# Patient Record
Sex: Male | Born: 1985 | Race: White | Hispanic: No | Marital: Married | Smoking: Former smoker
Health system: Southern US, Community
[De-identification: ages and names within clinical notes are randomized; demographics above are authoritative.]

---
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
## 2019-10-03 ENCOUNTER — Encounter (HOSPITAL_COMMUNITY): Payer: Self-pay | Admitting: *Deleted

## 2019-10-03 ENCOUNTER — Emergency Department (HOSPITAL_COMMUNITY)
Admission: EM | Admit: 2019-10-03 | Discharge: 2019-10-04 | Disposition: A | Discharge status: Home / Self Care | Payer: Self-pay | Attending: Emergency Medicine | Admitting: Emergency Medicine

## 2019-10-03 ENCOUNTER — Other Ambulatory Visit: Payer: Self-pay

## 2019-10-03 DIAGNOSIS — M79602 Pain in left arm: Secondary | ICD-10-CM | POA: Insufficient documentation

## 2019-10-03 DIAGNOSIS — M7989 Other specified soft tissue disorders: Secondary | ICD-10-CM | POA: Insufficient documentation

## 2019-10-03 DIAGNOSIS — F17210 Nicotine dependence, cigarettes, uncomplicated: Secondary | ICD-10-CM | POA: Insufficient documentation

## 2019-10-03 NOTE — ED Triage Notes (Signed)
Pt c/o swelling to left forearm area, thinks that he may have gotten bitten by an insect.

## 2019-10-03 NOTE — ED Notes (Signed)
Pt reports he was riding his bikeearlier today and does not recall being bit or stung by any kind of bug. Pts wife suggested evaluation for possible cellulitis in arm. Warmth and redness noted on left forearm over the right eye of a skull tattoo.

## 2019-10-04 MED ORDER — CEPHALEXIN 500 MG PO CAPS
500.0000 mg | ORAL_CAPSULE | Freq: Once | ORAL | Status: AC
  Administered 2019-10-04: 500 mg via ORAL
  Filled 2019-10-04: qty 1

## 2019-10-04 MED ORDER — CEPHALEXIN 500 MG PO CAPS: 500.0000 mg | ORAL_CAPSULE | Freq: Three times a day (TID) | ORAL | 0 refills | Status: AC

## 2019-10-04 NOTE — ED Notes (Signed)
ED Provider at bedside. 

## 2019-10-04 NOTE — ED Provider Notes (Signed)
[facility] EMERGENCY DEPARTMENT Provider Note   CSN: [id number] Arrival date & time: 10/03/19  2116    History Chief Complaint  Patient presents with  . Arm Pain    Curtis Hoffman is a 34 y.o. male.  The history is provided by the patient.  Arm Pain  He has no significant past history and comes in complaining of some pain and swelling of his left forearm.  He denies any trauma.  He notices that it feels a little warm.  His wife is concerned that he might have cellulitis so recommended he come in for further evaluation.  He rates pain at 1/10.  He denies fever, chills, sweats.  History reviewed. No pertinent past medical history.  There are no problems to display for this patient.   History reviewed. No pertinent surgical history.     No family history on file.  Social History   Tobacco Use  . Smoking status: Current Every Day Smoker  Substance Use Topics  . Alcohol use: Not Currently  . Drug use: Not Currently    Home Medications Prior to Admission medications   Not on File    Allergies    Patient has no known allergies.  Review of Systems   Review of Systems  All other systems reviewed and are negative.   Physical Exam Updated Vital Signs BP 140/70 (BP Location: Right Arm)   Pulse 82   Temp 98.6 F (37 C) (Oral)   Resp 16   Ht 5\' 4"  (1.626 m)   Wt (!) 171.9 kg   SpO2 100%   BMI 65.06 kg/m   Physical Exam Vitals and nursing note reviewed.   Morbidly obese 34 year old male, resting comfortably and in no acute distress. Vital signs are normal. Oxygen saturation is 100%, which is normal. Head is normocephalic and atraumatic. PERRLA, EOMI. Oropharynx is clear. Neck is nontender and supple without adenopathy or JVD. Back is nontender and there is no CVA tenderness. Lungs are clear without rales, wheezes, or rhonchi. Chest is nontender. Heart has regular rate and rhythm without murmur. Abdomen is soft, flat, nontender without masses or  hepatosplenomegaly and peristalsis is normoactive. Extremities: Extensive tattoos preclude careful evaluation of skin but no obvious erythema noted on the left forearm.  There is a suggestion of slight subcutaneous swelling without any induration or fluctuance.  There is no warmth to palpation and no tenderness. Skin is warm and dry without rash. Neurologic: Mental status is normal, cranial nerves are intact, there are no motor or sensory deficits.  ED Results / Procedures / Treatments   Labs (all labs ordered are listed, but only abnormal results are displayed) Labs Reviewed - No data to display  EKG None  Radiology No results found.  Procedures Ultrasound ED Soft Tissue  Date/Time: 10/04/2019 12:30 AM Performed by: Delora Fuel, MD Authorized by: Delora Fuel, MD   Procedure details:    Indications: evaluate for cellulitis     Transverse view:  Visualized   Longitudinal view:  Visualized   Images: archived   Location:    Location: upper extremity     Side:  Left Findings:     no abscess present    no cellulitis present   Medications Ordered in ED Medications  cephALEXin (KEFLEX) capsule 500 mg (has no administration in time range)    ED Course  I have reviewed the triage vital signs and the nursing notes.  MDM Rules/Calculators/A&P Mild pain and swelling of the left forearm.  With suggestion of some subcutaneous swelling, I am concerned about early abscess formation.  Limited bedside ultrasound was done showing no clear evidence of either abscess or cellulitis.  Will empirically placed on a short course of cephalexin.  Patient advised to use warm compresses, return if pain and swelling or worsening.  Possibility of early abscess that is not well enough formed to be seen on ultrasound was explained to patient.  Old records were reviewed, and he has no relevant past visits.  Final Clinical Impression(s) / ED Diagnoses Final diagnoses:  Left arm pain    Rx / DC  Orders ED Discharge Orders         Ordered    cephALEXin (KEFLEX) 500 MG capsule  3 times daily     10/04/19 0027           Dione Booze, MD 10/04/19 [phone]

## 2019-10-04 NOTE — Discharge Instructions (Addendum)
Apply warm compresses.  Take acetaminophen and/or ibuprofen as needed for pain.  If pain and swelling seem to be getting worse, return for reevaluation.

## 2022-02-26 ENCOUNTER — Other Ambulatory Visit: Payer: Self-pay

## 2022-02-26 ENCOUNTER — Encounter (HOSPITAL_COMMUNITY): Payer: Self-pay | Admitting: Emergency Medicine

## 2022-02-26 ENCOUNTER — Emergency Department (HOSPITAL_COMMUNITY)
Admission: EM | Admit: 2022-02-26 | Discharge: 2022-02-26 | Disposition: A | Discharge status: Home / Self Care | Payer: PRIVATE HEALTH INSURANCE | Attending: Emergency Medicine | Admitting: Emergency Medicine

## 2022-02-26 DIAGNOSIS — T1501XA Foreign body in cornea, right eye, initial encounter: Secondary | ICD-10-CM | POA: Insufficient documentation

## 2022-02-26 DIAGNOSIS — X58XXXA Exposure to other specified factors, initial encounter: Secondary | ICD-10-CM | POA: Insufficient documentation

## 2022-02-26 DIAGNOSIS — S0501XA Injury of conjunctiva and corneal abrasion without foreign body, right eye, initial encounter: Secondary | ICD-10-CM

## 2022-02-26 MED ORDER — FLUORESCEIN SODIUM 1 MG OP STRP
1.0000 | ORAL_STRIP | Freq: Once | OPHTHALMIC | Status: AC
  Administered 2022-02-26: 1 via OPHTHALMIC
  Filled 2022-02-26: qty 1

## 2022-02-26 MED ORDER — ERYTHROMYCIN 5 MG/GM OP OINT
TOPICAL_OINTMENT | Freq: Once | OPHTHALMIC | Status: AC
  Filled 2022-02-26: qty 3.5

## 2022-02-26 MED ORDER — TETRACAINE HCL 0.5 % OP SOLN
1.0000 [drp] | Freq: Once | OPHTHALMIC | Status: DC
  Filled 2022-02-26: qty 4

## 2022-02-26 MED ORDER — FLUORESCEIN SODIUM 1 MG OP STRP
1.0000 | ORAL_STRIP | Freq: Once | OPHTHALMIC | Status: DC
  Filled 2022-02-26: qty 1

## 2022-02-26 MED ORDER — ERYTHROMYCIN 5 MG/GM OP OINT: TOPICAL_OINTMENT | OPHTHALMIC | 0 refills | Status: AC

## 2022-02-26 MED ORDER — TETRACAINE HCL 0.5 % OP SOLN
2.0000 [drp] | Freq: Once | OPHTHALMIC | Status: AC
  Administered 2022-02-26: 2 [drp] via OPHTHALMIC

## 2022-02-26 NOTE — Discharge Instructions (Signed)
Your exam appears to be consistent with a small scratch across her eye as well as a very tiny foreign body in the lower cornea.  You will need to see an eye doctor.  I have given you the phone number for the eye doctor who is on-call, you should call their office in the morning, they are usually good about seeing you within 1 or 2 days.  The medicine that you will need to be using between now and then is called erythromycin ointment, please put about 1/4 inch strip or ribbon on your eyeball and blanket and 4 times a day.  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

## 2022-02-26 NOTE — ED Triage Notes (Signed)
Pt states he was "grinding something" on his sons truck without wearing eye protection and states he thinks he either has a piece of metal in his R eye or an "ember got in there". States he tried flusing his eye out but that his eye continues to burning and watering.

## 2022-02-26 NOTE — ED Notes (Signed)
Went over dc papers. All questions answered. 

## 2022-02-26 NOTE — ED Provider Notes (Signed)
[facility] EMERGENCY DEPARTMENT Provider Note   CSN: [id number] Arrival date & time: 02/26/22  2231     History  Chief Complaint  Patient presents with   Curtis Hoffman is a 36 y.o. male.   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  Today he was using a metal grinder when he felt something fly into his eye causing acute onset of pain and then a scratchy feeling for the rest of the day.  The patient denies any changes in his vision, states it is 2020, it has not been blurry but he has had some excessive watering on that side with some sensitivity as well.  He has no discomfort on the left side, no nausea or vomiting, no prior injuries to the eyes, he does not wear contact lenses.    Home Medications Prior to Admission medications   Medication Sig Start Date End Date Taking? Authorizing Provider  erythromycin ophthalmic ointment Place a 1/2 inch ribbon of ointment into the lower eyelid. 02/26/22  Yes Noemi Chapel, MD  cephALEXin (KEFLEX) 500 MG capsule Take 1 capsule (500 mg total) by mouth 3 (three) times daily. 2/35/36   Delora Fuel, MD      Allergies    Patient has no known allergies.    Review of Systems   Review of Systems  Eyes:  Positive for pain and redness.    Physical Exam Updated Vital Signs BP (!) 154/93 (BP Location: Left Arm)   Pulse 67   Temp 97.6 F (36.4 C) (Oral) Comment: Simultaneous filing. User may not have seen previous data. Comment (Src): Simultaneous filing. User may not have seen previous data.  Resp 18   Ht 1.626 m (5\' 4" )   Wt 111.6 kg   SpO2 99%   BMI 42.23 kg/m  Physical Exam Vitals and nursing note reviewed.  Constitutional:      Appearance: He is well-developed. He is not diaphoretic.  HENT:     Head: Normocephalic and atraumatic.  Eyes:     General:        Right eye: No discharge.        Left eye: No discharge.     Conjunctiva/sclera: Conjunctivae  normal.     Comments: Left eye examined, no signs of redness trauma or injury, periorbital tissues are clear, conjunctivitis clear, pupils normal and reactive.  Right eye examined, no signs of periorbital redness or swelling, upper and lower lids are normal, on eversion there is no signs of Curtis body, under tetracaine and fluorescein exam it is clear that there is a superficial abrasion across the middle of the cornea in the horizontal position, there is a very tiny Curtis body in the inferior position at approximately 6:00.  This is a pinprick appearance.  There is no hyphema, no hypopyon, normal-appearing pupil, no consensual pain, minimal conjunctival erythema.  Again there is no Curtis body seen that need to be taken out of the eye on eversion of the lids.  This was irrigated at the bedside.  There was fluorescein uptake in the very superficial abrasion.  Negative Seidel test  Pulmonary:     Effort: Pulmonary effort is normal. No respiratory distress.  Skin:    General: Skin is warm and dry.     Findings: No erythema or rash.  Neurological:     Mental Status: He is alert.     Coordination: Coordination  normal.     ED Results / Procedures / Treatments   Labs (all labs ordered are listed, but only abnormal results are displayed) Labs Reviewed - No data to display  EKG None  Radiology No results found.  Procedures Procedures    Medications Ordered in ED Medications  tetracaine (PONTOCAINE) 0.5 % ophthalmic solution 1 drop (1 drop Both Eyes Not Given 02/26/22 2247)  fluorescein ophthalmic strip 1 strip (1 strip Both Eyes Not Given 02/26/22 2247)  erythromycin ophthalmic ointment (has no administration in time range)  tetracaine (PONTOCAINE) 0.5 % ophthalmic solution 2 drop (2 drops Right Eye Given by Other 02/26/22 2246)  fluorescein ophthalmic strip 1 strip (1 strip Right Eye Given by Other 02/26/22 2246)    ED Course/ Medical Decision Making/ A&P                            Medical Decision Making Risk Prescription drug management.   Eye exam consistent with superficial corneal abrasion and a very small Curtis body.  This is so tiny that it does not even appear visualized as far as what is in this tiny area however it does not appear to be a globe rupture, he will need to be seen by ophthalmology, will start on erythromycin ointment.  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  He will be given referral to an ophthalmology office for follow-up, the patient is agreeable, he otherwise appears very stable, I do not think he needs an emergent ophthalmology consultation tonight but can be seen tomorrow.  He agrees to the plan        Final Clinical Impression(s) / ED Diagnoses Final diagnoses:  Abrasion of right cornea, initial encounter  Curtis body of right cornea, initial encounter    Rx / DC Orders ED Discharge Orders          Ordered    erythromycin ophthalmic ointment        02/26/22 2259              Noemi Chapel, MD 02/26/22 2303

## 2022-02-26 NOTE — ED Notes (Signed)
ED Provider at bedside. 

## 2022-02-27 ENCOUNTER — Encounter (HOSPITAL_COMMUNITY): Payer: Self-pay | Admitting: Emergency Medicine

## 2022-02-27 ENCOUNTER — Emergency Department (HOSPITAL_COMMUNITY)
Admission: EM | Admit: 2022-02-27 | Discharge: 2022-02-27 | Disposition: A | Discharge status: Home / Self Care | Payer: PRIVATE HEALTH INSURANCE | Attending: Emergency Medicine | Admitting: Emergency Medicine

## 2022-02-27 DIAGNOSIS — X58XXXA Exposure to other specified factors, initial encounter: Secondary | ICD-10-CM | POA: Diagnosis not present

## 2022-02-27 DIAGNOSIS — T1501XA Foreign body in cornea, right eye, initial encounter: Secondary | ICD-10-CM | POA: Diagnosis present

## 2022-02-27 MED ORDER — OXYCODONE-ACETAMINOPHEN 5-325 MG PO TABS: 2.0000 | ORAL_TABLET | ORAL | 0 refills | Status: AC | PRN

## 2022-02-27 MED ORDER — TETRACAINE HCL 0.5 % OP SOLN
2.0000 [drp] | Freq: Once | OPHTHALMIC | Status: AC
  Administered 2022-02-27: 2 [drp] via OPHTHALMIC
  Filled 2022-02-27: qty 4

## 2022-02-27 NOTE — ED Triage Notes (Signed)
Pt here earlier for possible metal in his eye. Pt states he was told to follow up with eye MD at 8am this morning, but he can't wait til then the pain in his eye is driving him crazy.

## 2022-02-27 NOTE — Discharge Instructions (Signed)
Take Percocet as prescribed as needed for pain.  Follow-up with ophthalmology as planned previously.

## 2022-02-27 NOTE — ED Provider Notes (Signed)
  [facility] EMERGENCY DEPARTMENT Provider Note   CSN: [id number] Arrival date & time: 02/27/22  0457     History  Chief Complaint  Patient presents with   Eye Pain    Curtis Hoffman is a 37 y.o. male.  Patient is a 36 year old male presenting with right thigh pain.  He was here several hours ago with similar complaints.  Patient was using a grinder to grind metal when a shard of metal got stuck in his eye.  He was referred to ophthalmology, but returns stating the pain in his eye is "driving him crazy".  He cannot sleep due to the pain.  He is to call ophthalmology after 8 AM, but cannot wait that long.  The history is provided by the patient.       Home Medications Prior to Admission medications   Medication Sig Start Date End Date Taking? Authorizing Provider  cephALEXin (KEFLEX) 500 MG capsule Take 1 capsule (500 mg total) by mouth 3 (three) times daily. 0/08/67   Delora Fuel, MD  erythromycin ophthalmic ointment Place a 1/2 inch ribbon of ointment into the lower eyelid. 02/26/22   Noemi Chapel, MD      Allergies    Patient has no known allergies.    Review of Systems   Review of Systems  All other systems reviewed and are negative.   Physical Exam Updated Vital Signs BP (!) 147/97   Pulse 88   Temp 98 F (36.7 C) (Oral)   Resp 20   Ht 5\' 4"  (1.626 m)   Wt 111.6 kg   SpO2 99%   BMI 42.23 kg/m  Physical Exam Vitals and nursing note reviewed.  Constitutional:      General: He is not in acute distress.    Appearance: Normal appearance. He is not ill-appearing.  HENT:     Head: Normocephalic and atraumatic.  Eyes:     Comments: The right pupil is round and reactive.  Anterior chamber is clear.  The cornea does have a tiny foreign body noted at approximately 6:00.  Pulmonary:     Effort: Pulmonary effort is normal.     Breath sounds: Normal breath sounds.  Neurological:     Mental Status: He is alert and oriented to person, place, and time.     ED  Results / Procedures / Treatments   Labs (all labs ordered are listed, but only abnormal results are displayed) Labs Reviewed - No data to display  EKG None  Radiology No results found.  Procedures Procedures    Medications Ordered in ED Medications  tetracaine (PONTOCAINE) 0.5 % ophthalmic solution 2 drop (has no administration in time range)    ED Course/ Medical Decision Making/ A&P  Patient returns with a corneal foreign body.  It was my intention to make an attempt at removal with a tuberculin syringe, however the patient could not tolerate this.  He was unable to sit still and would not allow me to do this.  He was given tetracaine drops and will be discharged with pain medication and follow-up as planned before with ophthalmology.  Final Clinical Impression(s) / ED Diagnoses Final diagnoses:  None    Rx / DC Orders ED Discharge Orders     None         Veryl Speak, MD 02/27/22 [id number]

## 2022-03-01 MED FILL — Oxycodone w/ Acetaminophen Tab 5-325 MG: ORAL | Qty: 6 | Status: AC

## 2022-05-26 ENCOUNTER — Ambulatory Visit
Admission: EM | Admit: 2022-05-26 | Discharge: 2022-05-26 | Disposition: A | Discharge status: Home / Self Care | Payer: PRIVATE HEALTH INSURANCE | Attending: Family Medicine | Admitting: Family Medicine

## 2022-05-26 ENCOUNTER — Encounter: Payer: Self-pay | Admitting: Emergency Medicine

## 2022-05-26 DIAGNOSIS — R509 Fever, unspecified: Secondary | ICD-10-CM | POA: Insufficient documentation

## 2022-05-26 DIAGNOSIS — J069 Acute upper respiratory infection, unspecified: Secondary | ICD-10-CM | POA: Insufficient documentation

## 2022-05-26 DIAGNOSIS — Z1152 Encounter for screening for COVID-19: Secondary | ICD-10-CM | POA: Insufficient documentation

## 2022-05-26 DIAGNOSIS — R059 Cough, unspecified: Secondary | ICD-10-CM | POA: Diagnosis not present

## 2022-05-26 MED ORDER — PROMETHAZINE-DM 6.25-15 MG/5ML PO SYRP: 5.0000 mL | ORAL_SOLUTION | Freq: Four times a day (QID) | ORAL | 0 refills | Status: AC | PRN

## 2022-05-26 MED ORDER — OSELTAMIVIR PHOSPHATE 75 MG PO CAPS: 75.0000 mg | ORAL_CAPSULE | Freq: Two times a day (BID) | ORAL | 0 refills | Status: AC

## 2022-05-26 MED ORDER — FLUTICASONE PROPIONATE 50 MCG/ACT NA SUSP: 1.0000 | Freq: Two times a day (BID) | NASAL | 2 refills | Status: AC

## 2022-05-26 NOTE — ED Triage Notes (Signed)
Fever and chest and nasal congestion, cough, sore throat since yesterday.  Has been taking day quil and nyquil without relief.

## 2022-05-26 NOTE — ED Provider Notes (Signed)
[facility]    CSN: [id number] Arrival date & time: 05/26/22  6720      History   Chief Complaint No chief complaint on file.   HPI Curtis Hoffman is a 37 y.o. male.   Presenting today with 1 day history of fever, nasal congestion, productive cough, sore throat, body aches, fatigue.  Denies chest pain, shortness of breath, abdominal pain, nausea vomiting or diarrhea.  So far trying DayQuil and NyQuil with minimal relief.  No known sick contacts recently.  No known pertinent chronic medical problems per patient.    History reviewed. No pertinent past medical history.  There are no problems to display for this patient.   History reviewed. No pertinent surgical history.     Home Medications    Prior to Admission medications   Medication Sig Start Date End Date Taking? Authorizing Provider  fluticasone (FLONASE) 50 MCG/ACT nasal spray Place 1 spray into both nostrils 2 (two) times daily. 05/26/22  Yes Volney American, PA-C  oseltamivir (TAMIFLU) 75 MG capsule Take 1 capsule (75 mg total) by mouth every 12 (twelve) hours. 05/26/22  Yes Volney American, PA-C  promethazine-dextromethorphan (PROMETHAZINE-DM) 6.25-15 MG/5ML syrup Take 5 mLs by mouth 4 (four) times daily as needed. 05/26/22  Yes Volney American, PA-C  cephALEXin (KEFLEX) 500 MG capsule Take 1 capsule (500 mg total) by mouth 3 (three) times daily. 9/47/09   Delora Fuel, MD  erythromycin ophthalmic ointment Place a 1/2 inch ribbon of ointment into the lower eyelid. 02/26/22   Noemi Chapel, MD  oxyCODONE-acetaminophen (PERCOCET) 5-325 MG tablet Take 2 tablets by mouth every 4 (four) hours as needed. 02/27/22   Veryl Speak, MD    Family History History reviewed. No pertinent family history.  Social History Social History   Tobacco Use   Smoking status: Former    Types: Cigarettes  Scientific laboratory technician Use: Former  Substance Use Topics   Alcohol use: Not Currently   Drug use:  Not Currently     Allergies   Patient has no known allergies.   Review of Systems Review of Systems Per HPI  Physical Exam Triage Vital Signs ED Triage Vitals  Enc Vitals Group     BP 05/26/22 0833 102/70     Pulse Rate 05/26/22 0833 95     Resp 05/26/22 0833 16     Temp 05/26/22 0833 99.2 F (37.3 C)     Temp Source 05/26/22 0833 Oral     SpO2 05/26/22 0833 96 %     Weight --      Height --      Head Circumference --      Peak Flow --      Pain Score 05/26/22 0834 0     Pain Loc --      Pain Edu? --      Excl. in [place]? --    No data found.  Updated Vital Signs BP 102/70 (BP Location: Right Arm)   Pulse 95   Temp 99.2 F (37.3 C) (Oral)   Resp 16   SpO2 96%   Visual Acuity Right Eye Distance:   Left Eye Distance:   Bilateral Distance:    Right Eye Near:   Left Eye Near:    Bilateral Near:     Physical Exam Vitals and nursing note reviewed.  Constitutional:      Appearance: He is well-developed.  HENT:     Head: Atraumatic.     Right  Ear: External ear normal.     Left Ear: External ear normal.     Nose: Rhinorrhea present.     Mouth/Throat:     Pharynx: Posterior oropharyngeal erythema present. No oropharyngeal exudate.  Eyes:     Conjunctiva/sclera: Conjunctivae normal.     Pupils: Pupils are equal, round, and reactive to light.  Cardiovascular:     Rate and Rhythm: Normal rate and regular rhythm.  Pulmonary:     Effort: Pulmonary effort is normal. No respiratory distress.     Breath sounds: No wheezing or rales.  Musculoskeletal:        General: Normal range of motion.     Cervical back: Normal range of motion and neck supple.  Lymphadenopathy:     Cervical: No cervical adenopathy.  Skin:    General: Skin is warm and dry.  Neurological:     Mental Status: He is alert and oriented to person, place, and time.  Psychiatric:        Behavior: Behavior normal.      UC Treatments / Results  Labs (all labs ordered are listed, but only  abnormal results are displayed) Labs Reviewed  SARS CORONAVIRUS 2 (TAT 6-24 HRS)    EKG   Radiology No results found.  Procedures Procedures (including critical care time)  Medications Ordered in UC Medications - No data to display  Initial Impression / Assessment and Plan / UC Course  I have reviewed the triage vital signs and the nursing notes.  Pertinent labs & imaging results that were available during my care of the patient were reviewed by me and considered in my medical decision making (see chart for details).     SPECT viral upper respiratory infection, likely influenza.  COVID testing pending for rule out, unable to test for flu today due to backorder on testing supplies.  Treat with Tamiflu, Phenergan DM, Flonase, supportive over-the-counter medications and home care.  Return for worsening symptoms.  Final Clinical Impressions(s) / UC Diagnoses   Final diagnoses:  Viral URI with cough  Fever, unspecified   Discharge Instructions   None    ED Prescriptions     Medication Sig Dispense Auth. Provider   oseltamivir (TAMIFLU) 75 MG capsule Take 1 capsule (75 mg total) by mouth every 12 (twelve) hours. 10 capsule Volney American, [place]   promethazine-dextromethorphan (PROMETHAZINE-DM) 6.25-15 MG/5ML syrup Take 5 mLs by mouth 4 (four) times daily as needed. 100 mL Volney American, PA-C   fluticasone [facility]) 50 MCG/ACT nasal spray Place 1 spray into both nostrils 2 (two) times daily. 16 g Volney American, [place]      PDMP not reviewed this encounter.   Merrie Roof [place], [place] 05/26/22 [phone]

## 2022-05-28 LAB — SARS CORONAVIRUS 2 (TAT 6-24 HRS): SARS Coronavirus 2: NEGATIVE

## 2023-07-20 DIAGNOSIS — Z1331 Encounter for screening for depression: Secondary | ICD-10-CM | POA: Diagnosis not present

## 2023-07-20 DIAGNOSIS — E6609 Other obesity due to excess calories: Secondary | ICD-10-CM | POA: Diagnosis not present

## 2023-07-20 DIAGNOSIS — Z6837 Body mass index (BMI) 37.0-37.9, adult: Secondary | ICD-10-CM | POA: Diagnosis not present

## 2023-07-20 DIAGNOSIS — Z0000 Encounter for general adult medical examination without abnormal findings: Secondary | ICD-10-CM | POA: Diagnosis not present
